# Patient Record
Sex: Female | Born: 1990 | Race: White | Hispanic: No | Marital: Single | Smoking: Current every day smoker
Health system: Southern US, Community
[De-identification: ages and names within clinical notes are randomized; demographics above are authoritative.]

## PROBLEM LIST (undated history)

## (undated) DIAGNOSIS — F32A Depression, unspecified: Secondary | ICD-10-CM

## (undated) DIAGNOSIS — G43909 Migraine, unspecified, not intractable, without status migrainosus: Secondary | ICD-10-CM

## (undated) DIAGNOSIS — F329 Major depressive disorder, single episode, unspecified: Secondary | ICD-10-CM

## (undated) HISTORY — PX: APPENDECTOMY: SHX54

---

## 2016-07-26 ENCOUNTER — Encounter (HOSPITAL_BASED_OUTPATIENT_CLINIC_OR_DEPARTMENT_OTHER): Payer: Self-pay | Admitting: Emergency Medicine

## 2016-07-26 ENCOUNTER — Emergency Department (HOSPITAL_BASED_OUTPATIENT_CLINIC_OR_DEPARTMENT_OTHER)
Admission: EM | Admit: 2016-07-26 | Discharge: 2016-07-26 | Disposition: A | Discharge status: Home / Self Care | Payer: 59 | Attending: Emergency Medicine | Admitting: Emergency Medicine

## 2016-07-26 ENCOUNTER — Emergency Department (HOSPITAL_BASED_OUTPATIENT_CLINIC_OR_DEPARTMENT_OTHER): Payer: 59

## 2016-07-26 DIAGNOSIS — Z79899 Other long term (current) drug therapy: Secondary | ICD-10-CM | POA: Diagnosis not present

## 2016-07-26 DIAGNOSIS — K529 Noninfective gastroenteritis and colitis, unspecified: Secondary | ICD-10-CM

## 2016-07-26 DIAGNOSIS — N342 Other urethritis: Secondary | ICD-10-CM

## 2016-07-26 DIAGNOSIS — R1013 Epigastric pain: Secondary | ICD-10-CM | POA: Diagnosis present

## 2016-07-26 DIAGNOSIS — F17220 Nicotine dependence, chewing tobacco, uncomplicated: Secondary | ICD-10-CM | POA: Diagnosis not present

## 2016-07-26 HISTORY — DX: Major depressive disorder, single episode, unspecified: F32.9

## 2016-07-26 HISTORY — DX: Migraine, unspecified, not intractable, without status migrainosus: G43.909

## 2016-07-26 HISTORY — DX: Depression, unspecified: F32.A

## 2016-07-26 LAB — COMPREHENSIVE METABOLIC PANEL
ALBUMIN: 3.8 g/dL (ref 3.5–5.0)
ALK PHOS: 82 U/L (ref 38–126)
ALT: 27 U/L (ref 14–54)
AST: 27 U/L (ref 15–41)
Anion gap: 8 (ref 5–15)
BILIRUBIN TOTAL: 0.4 mg/dL (ref 0.3–1.2)
BUN: 7 mg/dL (ref 6–20)
CALCIUM: 8.9 mg/dL (ref 8.9–10.3)
CO2: 23 mmol/L (ref 22–32)
CREATININE: 0.7 mg/dL (ref 0.44–1.00)
Chloride: 105 mmol/L (ref 101–111)
GFR calc Af Amer: >60 mL/min (ref >60)
GFR calc non Af Amer: >60 mL/min (ref >60)
GLUCOSE: 123 mg/dL — AB (ref 65–99)
Potassium: 3.6 mmol/L (ref 3.5–5.1)
SODIUM: 136 mmol/L (ref 135–145)
TOTAL PROTEIN: 6.8 g/dL (ref 6.5–8.1)

## 2016-07-26 LAB — URINALYSIS, MICROSCOPIC (REFLEX)

## 2016-07-26 LAB — CBC WITH DIFFERENTIAL/PLATELET
BASOS ABS: 0 10*3/uL (ref 0.0–0.1)
Basophils Relative: 0 %
Eosinophils Absolute: 0.1 10*3/uL (ref 0.0–0.7)
Eosinophils Relative: 1 %
HEMATOCRIT: 41.9 % (ref 36.0–46.0)
HEMOGLOBIN: 14.1 g/dL (ref 12.0–15.0)
Lymphocytes Relative: 10 %
Lymphs Abs: 1.2 10*3/uL (ref 0.7–4.0)
MCH: 28.9 pg (ref 26.0–34.0)
MCHC: 33.7 g/dL (ref 30.0–36.0)
MCV: 85.9 fL (ref 78.0–100.0)
MONOS PCT: 5 %
Monocytes Absolute: 0.6 10*3/uL (ref 0.1–1.0)
NEUTROS ABS: 10 10*3/uL — AB (ref 1.7–7.7)
NEUTROS PCT: 84 %
Platelets: 287 10*3/uL (ref 150–400)
RBC: 4.88 MIL/uL (ref 3.87–5.11)
RDW: 13.8 % (ref 11.5–15.5)
WBC: 11.9 10*3/uL — AB (ref 4.0–10.5)

## 2016-07-26 LAB — URINALYSIS, ROUTINE W REFLEX MICROSCOPIC
Glucose, UA: NEGATIVE mg/dL
HGB URINE DIPSTICK: NEGATIVE
Ketones, ur: 15 mg/dL — AB
NITRITE: POSITIVE — AB
PH: 5 (ref 5.0–8.0)
Protein, ur: NEGATIVE mg/dL
SPECIFIC GRAVITY, URINE: 1.037 — AB (ref 1.005–1.030)

## 2016-07-26 LAB — LIPASE, BLOOD: Lipase: 14 U/L (ref 11–51)

## 2016-07-26 LAB — PREGNANCY, URINE: PREG TEST UR: NEGATIVE

## 2016-07-26 MED ORDER — DICYCLOMINE HCL 10 MG PO CAPS
10.0000 mg | ORAL_CAPSULE | Freq: Once | ORAL | Status: AC
Start: 2016-07-26 — End: 2016-07-26
  Administered 2016-07-26: 10 mg via ORAL
  Filled 2016-07-26: qty 1

## 2016-07-26 MED ORDER — DICYCLOMINE HCL 20 MG PO TABS: 20.0000 mg | ORAL_TABLET | Freq: Two times a day (BID) | ORAL | 0 refills | Status: AC | PRN

## 2016-07-26 MED ORDER — METOCLOPRAMIDE HCL 10 MG PO TABS: 10.0000 mg | ORAL_TABLET | Freq: Four times a day (QID) | ORAL | 0 refills | Status: AC | PRN

## 2016-07-26 MED ORDER — HYDROCODONE-ACETAMINOPHEN 5-325 MG PO TABS: 1.0000 | ORAL_TABLET | Freq: Four times a day (QID) | ORAL | 0 refills | Status: DC | PRN

## 2016-07-26 MED ORDER — SODIUM CHLORIDE 0.9 % IV BOLUS (SEPSIS)
1000.0000 mL | Freq: Once | INTRAVENOUS | Status: AC
  Administered 2016-07-26: 1000 mL via INTRAVENOUS

## 2016-07-26 MED ORDER — DEXTROSE 5 % IV SOLN
1.0000 g | Freq: Once | INTRAVENOUS | Status: AC
  Administered 2016-07-26: 1 g via INTRAVENOUS
  Filled 2016-07-26: qty 10

## 2016-07-26 MED ORDER — MORPHINE SULFATE (PF) 4 MG/ML IV SOLN
4.0000 mg | Freq: Once | INTRAVENOUS | Status: AC
  Administered 2016-07-26: 4 mg via INTRAVENOUS
  Filled 2016-07-26: qty 1

## 2016-07-26 MED ORDER — IOPAMIDOL (ISOVUE-300) INJECTION 61%
100.0000 mL | Freq: Once | INTRAVENOUS | Status: AC | PRN
  Administered 2016-07-26: 100 mL via INTRAVENOUS

## 2016-07-26 MED ORDER — CIPROFLOXACIN HCL 500 MG PO TABS: 500.0000 mg | ORAL_TABLET | Freq: Two times a day (BID) | ORAL | 0 refills | Status: DC

## 2016-07-26 MED ORDER — FAMOTIDINE IN NACL 20-0.9 MG/50ML-% IV SOLN
20.0000 mg | Freq: Once | INTRAVENOUS | Status: AC
Start: 2016-07-26 — End: 2016-07-26
  Administered 2016-07-26: 20 mg via INTRAVENOUS
  Filled 2016-07-26: qty 50

## 2016-07-26 MED ORDER — ONDANSETRON HCL 4 MG/2ML IJ SOLN
4.0000 mg | Freq: Once | INTRAMUSCULAR | Status: AC
Start: 2016-07-26 — End: 2016-07-26
  Administered 2016-07-26: 4 mg via INTRAVENOUS
  Filled 2016-07-26: qty 2

## 2016-07-26 MED FILL — CIPROFLOXACIN HCL 500 MG TA: 500 | 7 days supply | Qty: 14 | Fill #0

## 2016-07-26 MED FILL — HYDROCODON-APAP 5-325: 5-325 | 2 days supply | Qty: 6 | Fill #0

## 2016-07-26 MED FILL — DICYCLOMINE 20 MG TABLET: 20 | 7 days supply | Qty: 15 | Fill #0

## 2016-07-26 MED FILL — METOCLOPRAMIDE 10 MG TABLET: 10 | 2 days supply | Qty: 10 | Fill #0

## 2016-07-26 NOTE — ED Notes (Signed)
ED Provider at bedside. 

## 2016-07-26 NOTE — ED Notes (Signed)
Pt ambulated to restroom with 2 staff assist.

## 2016-07-26 NOTE — ED Triage Notes (Signed)
Pt reports having pain in her side and her back. Pt also reports nausea and vomiting. Pt reports this started about 0400 this am. Pt denies hx of kidney stones.

## 2016-07-26 NOTE — ED Notes (Signed)
Pt returned from CT at this time.  

## 2016-07-26 NOTE — Discharge Instructions (Signed)
Take cipro twice daily for a week for urinary tract infection and colitis.   You have an inflamed colon and likely will have pain for several days.   Take tylenol, motrin for pain.   Take reglan as needed for nausea or vomiting. Stay hydrated.   Take bentyl for abdominal cramps.   Take vicodin for severe abdominal pain. Do NOT drive with it   See your doctor  Return to ER if you have severe abdominal pain, vomiting, fevers, dehydration.

## 2016-07-26 NOTE — ED Provider Notes (Signed)
[facility]   CSN: [id number] Arrival date & time: 07/26/16  0631     History   Chief Complaint Chief Complaint  Patient presents with  . Abdominal Pain    Possible kidney stone    HPI Barbara Wright is a 26 y.o. female hx of depression, migraines, With acute onset of abdominal pain, vomiting. Patient states that she did eat some [place] food last night but it was all cooked. This morning she woke up around 3 AM and had sudden onset of severe bilateral flank pain and epigastric pain that is constant and sharp.. Patient states that she was unable to get back to sleep due to the severe pain. Patient had several episodes of nonbilious nonbloody vomiting. Denies any diarrhea. She has an IUD placed recently but denies any vaginal bleeding and denies any dysuria but she wasn't sure if there is any blood in her exam. Has no history of kidney stones. Had a previous appendectomy. Patient denies any drug or alcohol use.    The history is provided by the patient.    Past Medical History:  Diagnosis Date  . Depression   . Migraines     There are no active problems to display for this patient.   Past Surgical History:  Procedure Laterality Date  . APPENDECTOMY      OB History    No data available       Home Medications    Prior to Admission medications   Medication Sig Start Date End Date Taking? Authorizing Provider  buPROPion (WELLBUTRIN XL) 300 MG 24 hr tablet Take 300 mg by mouth daily.   Yes Historical Provider, MD    Family History No family history on file.  Social History Social History  Substance Use Topics  . Smoking status: Never Smoker  . Smokeless tobacco: Current User    Types: Chew  . Alcohol use No     Allergies   Patient has no known allergies.   Review of Systems Review of Systems  Gastrointestinal: Positive for abdominal pain and vomiting.  All other systems reviewed and are negative.    Physical Exam Updated  Vital Signs BP 105/68 (BP Location: Left Arm)   Pulse 84   Temp 97.8 F (36.6 C) (Oral)   Resp 18   Ht  (1.702 m)   Wt 180 lb (81.6 kg)   SpO2 96%   BMI 28.19 kg/m   Physical Exam  Constitutional: She is oriented to person, place, and time.  Uncomfortable, holding her upper abdomen   HENT:  Head: Normocephalic.  MM dry   Eyes: EOM are normal. Pupils are equal, round, and reactive to light.  Neck: Normal range of motion. Neck supple.  Cardiovascular: Normal rate, regular rhythm and normal heart sounds.   Pulmonary/Chest: Effort normal and breath sounds normal. No respiratory distress. She has no wheezes. She has no rales.  Abdominal: Soft. Bowel sounds are normal.  + diffuse epigastric, RUQ, and LUQ and bilateral CVAT. No lower abdominal tenderness   Musculoskeletal: Normal range of motion.  Neurological: She is alert and oriented to person, place, and time. No cranial nerve deficit. Coordination normal.  Skin: Skin is warm.  Psychiatric: She has a normal mood and affect.  Nursing note and vitals reviewed.    ED Treatments / Results  Labs (all labs ordered are listed, but only abnormal results are displayed) Labs Reviewed  URINALYSIS, ROUTINE W REFLEX MICROSCOPIC - Abnormal; Notable for the following:  Result Value   Color, Urine ORANGE (*)    APPearance CLOUDY (*)    Specific Gravity, Urine 1.037 (*)    Bilirubin Urine MODERATE (*)    Ketones, ur 15 (*)    Nitrite POSITIVE (*)    Leukocytes, UA SMALL (*)    All other components within normal limits  CBC WITH DIFFERENTIAL/PLATELET - Abnormal; Notable for the following:    WBC 11.9 (*)    Neutro Abs 10.0 (*)    All other components within normal limits  COMPREHENSIVE METABOLIC PANEL - Abnormal; Notable for the following:    Glucose, Bld 123 (*)    All other components within normal limits  URINALYSIS, MICROSCOPIC (REFLEX) - Abnormal; Notable for the following:    Bacteria, UA MANY (*)    Squamous  Epithelial / LPF 6-30 (*)    All other components within normal limits  URINE CULTURE  PREGNANCY, URINE  LIPASE, BLOOD    EKG  EKG Interpretation None       Radiology Ct Abdomen Pelvis W Contrast  Result Date: 07/26/2016 CLINICAL DATA:  Unable to drink oral contrast fusion nausea and vomiting. Left-sided flank pain. EXAM: CT ABDOMEN AND PELVIS WITH CONTRAST TECHNIQUE: Multidetector CT imaging of the abdomen and pelvis was performed using the standard protocol following bolus administration of intravenous contrast. CONTRAST:  ISOVUE-300 IOPAMIDOL (ISOVUE-300) INJECTION 61% COMPARISON:  None. FINDINGS: Lower chest: No acute abnormality. Hepatobiliary: No focal liver abnormality is seen. No gallstones, gallbladder wall thickening, or biliary dilatation. Pancreas: Unremarkable. No pancreatic ductal dilatation or surrounding inflammatory changes. Spleen: Normal in size without focal abnormality. Adrenals/Urinary Tract: Adrenal glands are unremarkable. Kidneys are normal, without renal calculi, focal lesion, or hydronephrosis. Bladder is unremarkable. Stomach/Bowel: Stomach is within normal limits. Prior appendectomy. No bowel dilatation to suggest obstruction. Large amount of stool in the ascending and transverse colon. Moderate amount of stool in the descending colon. Mild bowel wall thickening involving the hepatic flexure which may reflect mild colitis. No pneumatosis, pneumoperitoneum or portal venous gas. Vascular/Lymphatic: Normal caliber abdominal aorta. No lymphadenopathy. Reproductive: Uterus and bilateral adnexa are unremarkable. Intrauterine device in the uterus. Other: No fluid collection or hematoma. Musculoskeletal: No acute osseous abnormality. No lytic or sclerotic osseous lesion. IMPRESSION: 1. Moderate amount of stool in the descending colon. Mild bowel wall thickening involving the hepatic flexure which may reflect mild colitis secondary to an infectious or inflammatory etiology.  Electronically Signed   By: Elige Ko   On: 07/26/2016 08:48    Procedures Procedures (including critical care time)  Medications Ordered in ED Medications  ondansetron [facility]) injection 4 mg (4 mg Intravenous Given 07/26/16 0702)  sodium chloride 0.9 % bolus 1,000 mL (1,000 mLs Intravenous New Bag/Given 07/26/16 0728)  morphine 4 MG/ML injection 4 mg (4 mg Intravenous Given 07/26/16 0728)  famotidine (PEPCID) IVPB 20 mg premix (0 mg Intravenous Stopped 07/26/16 0752)  cefTRIAXone (ROCEPHIN) 1 g in dextrose 5 % 50 mL IVPB (0 g Intravenous Stopped 07/26/16 0816)  iopamidol (ISOVUE-300) 61 % injection 100 mL (100 mLs Intravenous Contrast Given 07/26/16 0830)  dicyclomine (BENTYL) capsule 10 mg (10 mg Oral Given 07/26/16 0935)  morphine 4 MG/ML injection 4 mg (4 mg Intravenous Given 07/26/16 0935)     Initial Impression / Assessment and Plan / ED Course  I have reviewed the triage vital signs and the nursing notes.  Pertinent labs & imaging results that were available during my care of the patient were reviewed by me and considered  in my medical decision making (see chart for details).     Barbara Wright is a 26 y.o. female here with acute onset of upper abdominal pain. Consider gastroenteritis vs pancreatitis vs perforated ulcer vs cholecystitis vs renal colic vs pyelo. Will get labs, LFTs, lipase, UA, CT ab/pel.    9:47 AM WBC 12. UA + leuk and nitrate and bacteria but only 0-5 WBC. CT showed constipation and colitis. I think the UA likely contamination from colitis. I gave her rocephin IV in the ED, IVF, bentyl, pain meds and zofran. She tolerated PO in the ED and pain controlled. Will try cipro as it will cover most bacteria in the colon and UTI. I will hold off on flagyl for now as she has been vomiting and I doubt C diff. Urine culture sent.   Final Clinical Impressions(s) / ED Diagnoses   Final diagnoses:  None    New Prescriptions New Prescriptions   No medications on file       Charlynne Pander, MD 07/26/16 [phone]

## 2016-07-26 NOTE — ED Notes (Signed)
Pt provided with PO ginger ale at this time

## 2016-07-26 NOTE — ED Notes (Signed)
Pt transported to CT via stretcher at this time.  

## 2016-07-26 NOTE — ED Notes (Signed)
MD at bedside discussing results with patient at this time. 

## 2016-07-27 LAB — URINE CULTURE: CULTURE: NO GROWTH

## 2016-08-27 ENCOUNTER — Emergency Department (HOSPITAL_BASED_OUTPATIENT_CLINIC_OR_DEPARTMENT_OTHER)
Admission: EM | Admit: 2016-08-27 | Discharge: 2016-08-28 | Disposition: A | Discharge status: Home / Self Care | Payer: 59 | Attending: Emergency Medicine | Admitting: Emergency Medicine

## 2016-08-27 ENCOUNTER — Encounter (HOSPITAL_BASED_OUTPATIENT_CLINIC_OR_DEPARTMENT_OTHER): Payer: Self-pay | Admitting: Emergency Medicine

## 2016-08-27 DIAGNOSIS — F17220 Nicotine dependence, chewing tobacco, uncomplicated: Secondary | ICD-10-CM | POA: Insufficient documentation

## 2016-08-27 DIAGNOSIS — G43001 Migraine without aura, not intractable, with status migrainosus: Secondary | ICD-10-CM | POA: Diagnosis not present

## 2016-08-27 DIAGNOSIS — Z79899 Other long term (current) drug therapy: Secondary | ICD-10-CM | POA: Diagnosis not present

## 2016-08-27 DIAGNOSIS — R51 Headache: Secondary | ICD-10-CM | POA: Diagnosis present

## 2016-08-27 NOTE — ED Provider Notes (Signed)
[facility]   CSN: [id number] Arrival date & time: 08/27/16  2157  By signing my name below, I, Rosario Adie, attest that this documentation has been prepared under the direction and in the presence of Emillio Ngo, Mayer Masker, MD. Electronically Signed: Rosario Adie, ED Scribe. 08/28/16. 12:09 AM.  History   Chief Complaint Chief Complaint  Patient presents with  . Migraine   The history is provided by the patient. No language interpreter was used.    HPI Comments: Barbara Wright is a 26 y.o. female with a PMHx of migraines, who presents to the Emergency Department complaining of persistent generalized headache beginning several hours ago. She rates her current pain as 8/10. She notes associated nausea, vomiting, and photophobia. Pt has a h/o migraines and she states that her pain feels similar to this. Her pain is worse with bright light. She has not taken anything for her pain prior to coming into the ED. Pt typically takes Triptan for her migraines, however, she is currently out of this medication and was unable to fill her prescription tonight. She denies fever, neck stiffness, or any other associated symptoms.   Past Medical History:  Diagnosis Date  . Depression   . Migraines    There are no active problems to display for this patient.  Past Surgical History:  Procedure Laterality Date  . APPENDECTOMY     OB History    No data available     Home Medications    Prior to Admission medications   Medication Sig Start Date End Date Taking? Authorizing Provider  buPROPion (WELLBUTRIN XL) 300 MG 24 hr tablet Take 300 mg by mouth daily.    [provider]  ciprofloxacin (CIPRO) 500 MG tablet Take 1 tablet (500 mg total) by mouth 2 (two) times daily. One po bid x 7 days 07/26/16   Charlynne Pander, MD  dicyclomine (BENTYL) 20 MG tablet Take 1 tablet (20 mg total) by mouth 2 (two) times daily as needed for spasms. 07/26/16   Charlynne Pander, MD  HYDROcodone-acetaminophen (NORCO/VICODIN) 5-325 MG tablet Take 1 tablet by mouth every 6 (six) hours as needed. 07/26/16   Charlynne Pander, MD  metoCLOPramide (REGLAN) 10 MG tablet Take 1 tablet (10 mg total) by mouth every 6 (six) hours as needed for nausea (nausea/headache). 07/26/16   Charlynne Pander, MD   Family History No family history on file.  Social History Social History  Substance Use Topics  . Smoking status: Never Smoker  . Smokeless tobacco: Current User    Types: Chew  . Alcohol use No   Allergies   Patient has no known allergies.  Review of Systems Review of Systems  Constitutional: Negative for fever.  Eyes: Positive for photophobia.  Gastrointestinal: Positive for nausea and vomiting.  Musculoskeletal: Negative for neck stiffness.  Neurological: Positive for headaches.  All other systems reviewed and are negative.  Physical Exam Updated Vital Signs BP 114/78 (BP Location: Right Arm)   Pulse 88   Temp 98.7 F (37.1 C) (Oral)   Resp 18   Ht 5\' 7"  (1.702 m)   Wt 170 lb (77.1 kg)   SpO2 100%   BMI 26.63 kg/m   Physical Exam  Constitutional: She is oriented to person, place, and time. She appears well-developed and well-nourished.  Eyes closed and uncomfortable appearing but in no acute distress  HENT:  Head: Normocephalic and atraumatic.  Eyes: EOM are normal. Pupils are equal, round,  and reactive to light.  Neck: Normal range of motion. Neck supple.  No meningismus  Cardiovascular: Normal rate, regular rhythm and normal heart sounds.   Pulmonary/Chest: Effort normal and breath sounds normal. No respiratory distress. She has no wheezes.  Neurological: She is alert and oriented to person, place, and time.  Cranial nerves II through XII intact, 5 out of 5 strength in all 4 extremities, no dysmetria to finger-nose-finger  Skin: Skin is warm and dry.  Psychiatric: She has a normal mood and affect.  Nursing note and vitals reviewed.  ED  Treatments / Results  DIAGNOSTIC STUDIES: Oxygen Saturation is 100% on RA, normal by my interpretation.   COORDINATION OF CARE: 11:59 PM-Discussed next steps with pt. Pt verbalized understanding and is agreeable with the plan.   Labs (all labs ordered are listed, but only abnormal results are displayed) Labs Reviewed - No data to display  EKG  EKG Interpretation None      Radiology No results found.  Procedures Procedures   Medications Ordered in ED Medications  sodium chloride 0.9 % bolus 1,000 mL (1,000 mLs Intravenous New Bag/Given 08/28/16 0120)  prochlorperazine (COMPAZINE) injection 10 mg (10 mg Intravenous Given 08/28/16 0122)  diphenhydrAMINE (BENADRYL) injection 25 mg (25 mg Intravenous Given 08/28/16 0121)  ketorolac (TORADOL) 30 MG/ML injection 30 mg (30 mg Intravenous Given 08/28/16 0121)  ondansetron (ZOFRAN) injection 4 mg (4 mg Intravenous Given 08/28/16 0121)    Initial Impression / Assessment and Plan / ED Course  I have reviewed the triage vital signs and the nursing notes.  Pertinent labs & imaging results that were available during my care of the patient were reviewed by me and considered in my medical decision making (see chart for details).     Patient presents with headache. Reports headache is consistent with migraine. She is nontoxic and nonfocal. Patient was given migraine cocktail.    2:40 AM On recheck, patient reports improvement of symptoms. She states that she feels ready to go home.  After history, exam, and medical workup I feel the patient has been appropriately medically screened and is safe for discharge home. Pertinent diagnoses were discussed with the patient. Patient was given return precautions.   Final Clinical Impressions(s) / ED Diagnoses   Final diagnoses:  Migraine without aura and with status migrainosus, not intractable   New Prescriptions New Prescriptions   No medications on file   I personally performed the services  described in this documentation, which was scribed in my presence. The recorded information has been reviewed and is accurate.     Shon BatonHorton, Raevin Wierenga F, MD 08/28/16 [phone]

## 2016-08-27 NOTE — ED Triage Notes (Signed)
PT presents to ED with complaints of migraine for afew hours now pt sts she is out of her migraine meds to take at home.

## 2016-08-28 MED ORDER — SODIUM CHLORIDE 0.9 % IV BOLUS (SEPSIS)
1000.0000 mL | Freq: Once | INTRAVENOUS | Status: AC
Start: 2016-08-28 — End: 2016-08-28
  Administered 2016-08-28: 1000 mL via INTRAVENOUS

## 2016-08-28 MED ORDER — ONDANSETRON HCL 4 MG/2ML IJ SOLN
4.0000 mg | Freq: Once | INTRAMUSCULAR | Status: AC
  Administered 2016-08-28: 4 mg via INTRAVENOUS
  Filled 2016-08-28: qty 2

## 2016-08-28 MED ORDER — DIPHENHYDRAMINE HCL 50 MG/ML IJ SOLN
25.0000 mg | Freq: Once | INTRAMUSCULAR | Status: AC
  Administered 2016-08-28: 25 mg via INTRAVENOUS
  Filled 2016-08-28: qty 1

## 2016-08-28 MED ORDER — KETOROLAC TROMETHAMINE 30 MG/ML IJ SOLN
30.0000 mg | Freq: Once | INTRAMUSCULAR | Status: AC
  Administered 2016-08-28: 30 mg via INTRAVENOUS
  Filled 2016-08-28: qty 1

## 2016-08-28 MED ORDER — PROCHLORPERAZINE EDISYLATE 5 MG/ML IJ SOLN
10.0000 mg | Freq: Once | INTRAMUSCULAR | Status: AC
  Administered 2016-08-28: 10 mg via INTRAVENOUS
  Filled 2016-08-28: qty 2

## 2017-02-17 ENCOUNTER — Emergency Department (HOSPITAL_BASED_OUTPATIENT_CLINIC_OR_DEPARTMENT_OTHER): Payer: Self-pay

## 2017-02-17 ENCOUNTER — Emergency Department (HOSPITAL_BASED_OUTPATIENT_CLINIC_OR_DEPARTMENT_OTHER)
Admission: EM | Admit: 2017-02-17 | Discharge: 2017-02-17 | Disposition: A | Discharge status: Home / Self Care | Payer: Self-pay | Attending: Emergency Medicine | Admitting: Emergency Medicine

## 2017-02-17 ENCOUNTER — Encounter (HOSPITAL_BASED_OUTPATIENT_CLINIC_OR_DEPARTMENT_OTHER): Payer: Self-pay | Admitting: Emergency Medicine

## 2017-02-17 DIAGNOSIS — M25462 Effusion, left knee: Secondary | ICD-10-CM | POA: Insufficient documentation

## 2017-02-17 DIAGNOSIS — F17210 Nicotine dependence, cigarettes, uncomplicated: Secondary | ICD-10-CM | POA: Insufficient documentation

## 2017-02-17 DIAGNOSIS — R600 Localized edema: Secondary | ICD-10-CM | POA: Insufficient documentation

## 2017-02-17 DIAGNOSIS — R609 Edema, unspecified: Secondary | ICD-10-CM

## 2017-02-17 DIAGNOSIS — Z79899 Other long term (current) drug therapy: Secondary | ICD-10-CM | POA: Insufficient documentation

## 2017-02-17 LAB — COMPREHENSIVE METABOLIC PANEL
ALK PHOS: 80 U/L (ref 38–126)
ALT: 24 U/L (ref 14–54)
AST: 27 U/L (ref 15–41)
Albumin: 3.4 g/dL — ABNORMAL LOW (ref 3.5–5.0)
Anion gap: 4 — ABNORMAL LOW (ref 5–15)
BILIRUBIN TOTAL: 0.4 mg/dL (ref 0.3–1.2)
BUN: 11 mg/dL (ref 6–20)
CALCIUM: 8.4 mg/dL — AB (ref 8.9–10.3)
CO2: 25 mmol/L (ref 22–32)
CREATININE: 0.65 mg/dL (ref 0.44–1.00)
Chloride: 106 mmol/L (ref 101–111)
GFR calc non Af Amer: >60 mL/min (ref >60)
GLUCOSE: 109 mg/dL — AB (ref 65–99)
Potassium: 3.6 mmol/L (ref 3.5–5.1)
SODIUM: 135 mmol/L (ref 135–145)
TOTAL PROTEIN: 6.4 g/dL — AB (ref 6.5–8.1)

## 2017-02-17 LAB — CBC WITH DIFFERENTIAL/PLATELET
BASOS PCT: 1 %
Basophils Absolute: 0.1 10*3/uL (ref 0.0–0.1)
Eosinophils Absolute: 0.4 10*3/uL (ref 0.0–0.7)
Eosinophils Relative: 7 %
HEMATOCRIT: 37.3 % (ref 36.0–46.0)
HEMOGLOBIN: 12.1 g/dL (ref 12.0–15.0)
LYMPHS ABS: 1.8 10*3/uL (ref 0.7–4.0)
LYMPHS PCT: 28 %
MCH: 27.1 pg (ref 26.0–34.0)
MCHC: 32.4 g/dL (ref 30.0–36.0)
MCV: 83.4 fL (ref 78.0–100.0)
MONO ABS: 0.5 10*3/uL (ref 0.1–1.0)
MONOS PCT: 8 %
NEUTROS ABS: 3.7 10*3/uL (ref 1.7–7.7)
NEUTROS PCT: 56 %
Platelets: 274 10*3/uL (ref 150–400)
RBC: 4.47 MIL/uL (ref 3.87–5.11)
RDW: 14.2 % (ref 11.5–15.5)
WBC: 6.5 10*3/uL (ref 4.0–10.5)

## 2017-02-17 LAB — URINALYSIS, ROUTINE W REFLEX MICROSCOPIC
BILIRUBIN URINE: NEGATIVE
GLUCOSE, UA: NEGATIVE mg/dL
Hgb urine dipstick: NEGATIVE
Ketones, ur: NEGATIVE mg/dL
Leukocytes, UA: NEGATIVE
NITRITE: NEGATIVE
PH: 5.5 (ref 5.0–8.0)
Protein, ur: NEGATIVE mg/dL
SPECIFIC GRAVITY, URINE: 1.025 (ref 1.005–1.030)

## 2017-02-17 LAB — TSH: TSH: 0.688 u[IU]/mL (ref 0.350–4.500)

## 2017-02-17 LAB — D-DIMER, QUANTITATIVE: D-Dimer, Quant: 1.94 ug/mL-FEU — ABNORMAL HIGH (ref 0.00–0.50)

## 2017-02-17 LAB — PREGNANCY, URINE: Preg Test, Ur: NEGATIVE

## 2017-02-17 MED ORDER — HYDROCHLOROTHIAZIDE 25 MG PO TABS
25.0000 mg | ORAL_TABLET | Freq: Once | ORAL | Status: AC
  Administered 2017-02-17: 25 mg via ORAL
  Filled 2017-02-17: qty 1

## 2017-02-17 MED ORDER — HYDROCHLOROTHIAZIDE 25 MG PO TABS: 25.0000 mg | ORAL_TABLET | Freq: Every day | ORAL | 0 refills | Status: DC

## 2017-02-17 MED ORDER — KETOROLAC TROMETHAMINE 30 MG/ML IJ SOLN
30.0000 mg | Freq: Once | INTRAMUSCULAR | Status: AC
  Administered 2017-02-17: 30 mg via INTRAVENOUS

## 2017-02-17 MED ORDER — KETOROLAC TROMETHAMINE 10 MG PO TABS: 10.0000 mg | ORAL_TABLET | Freq: Four times a day (QID) | ORAL | 0 refills | Status: AC | PRN

## 2017-02-17 MED ORDER — KETOROLAC TROMETHAMINE 30 MG/ML IJ SOLN
30.0000 mg | Freq: Once | INTRAMUSCULAR | Status: DC
  Filled 2017-02-17: qty 1

## 2017-02-17 NOTE — Discharge Instructions (Signed)
Compression hose while at work

## 2017-02-17 NOTE — ED Provider Notes (Signed)
[facility] EMERGENCY DEPARTMENT Provider Note   CSN: [id number] Arrival date & time: 02/17/17  [id number]     History   Chief Complaint Chief Complaint  Patient presents with  . Knee Pain    swelling to legs    HPI Barbara Wright is a 26 y.o. female.  Pt presents to the ED today with bilateral leg swelling and left knee pain.  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  She said she wears tennis shoes at work.  She has not tried compression hose.  The pt noticed that her legs have been swollen for about 2 weeks.  Her knee started hurting as well and it hurts to bend it.      Past Medical History:  Diagnosis Date  . Depression   . Migraines     There are no active problems to display for this patient.   Past Surgical History:  Procedure Laterality Date  . APPENDECTOMY      OB History    No data available       Home Medications    Prior to Admission medications   Medication Sig Start Date End Date Taking? Authorizing Provider  promethazine (PHENERGAN) 25 MG tablet Take 25 mg by mouth every 6 (six) hours as needed for nausea or vomiting.   Yes [provider]  SUMAtriptan (IMITREX) 100 MG tablet Take 100 mg by mouth every 2 (two) hours as needed for migraine. May repeat in 2 hours if headache persists or recurs.   Yes [provider]  buPROPion (WELLBUTRIN XL) 300 MG 24 hr tablet Take 300 mg by mouth daily.    [provider]  ciprofloxacin (CIPRO) 500 MG tablet Take 1 tablet (500 mg total) by mouth 2 (two) times daily. One po bid x 7 days 07/26/16   Charlynne PanderYao, David Hsienta, MD  dicyclomine (BENTYL) 20 MG tablet Take 1 tablet (20 mg total) by mouth 2 (two) times daily as needed for spasms. 07/26/16   Charlynne PanderYao, David Hsienta, MD  hydrochlorothiazide (HYDRODIURIL) 25 MG tablet Take 1 tablet (25 mg total) by mouth daily. 02/17/17   Jacalyn LefevreHaviland, Lasheena Frieze, MD  HYDROcodone-acetaminophen (NORCO/VICODIN)  5-325 MG tablet Take 1 tablet by mouth every 6 (six) hours as needed. 07/26/16   Charlynne PanderYao, David Hsienta, MD  ketorolac (TORADOL) 10 MG tablet Take 1 tablet (10 mg total) by mouth every 6 (six) hours as needed. 02/17/17   Jacalyn LefevreHaviland, Nakoa Ganus, MD  metoCLOPramide (REGLAN) 10 MG tablet Take 1 tablet (10 mg total) by mouth every 6 (six) hours as needed for nausea (nausea/headache). 07/26/16   Charlynne PanderYao, David Hsienta, MD    Family History No family history on file.  Social History Social History  Substance Use Topics  . Smoking status: Current Every Day Smoker    Packs/day: 0.50    Types: Cigarettes  . Smokeless tobacco: Former NeurosurgeonUser    Types: Chew  . Alcohol use No     Allergies   Patient has no known allergies.   Review of Systems Review of Systems  Cardiovascular: Positive for leg swelling.  Musculoskeletal:       Left knee pain  All other systems reviewed and are negative.    Physical Exam Updated Vital Signs BP 103/65 (BP Location: Left Arm)   Pulse 79   Temp 98.6 F (37 C) (Oral)   Resp 16   Ht 5\' 7"  (1.702 m)   Wt  81.6 kg (180 lb)   SpO2 99%   BMI 28.19 kg/m   Physical Exam  Constitutional: She is oriented to person, place, and time. She appears well-developed and well-nourished.  HENT:  Head: Normocephalic and atraumatic.  Right Ear: External ear normal.  Left Ear: External ear normal.  Nose: Nose normal.  Mouth/Throat: Oropharynx is clear and moist.  Eyes: Pupils are equal, round, and reactive to light. Conjunctivae and EOM are normal.  Neck: Normal range of motion. Neck supple.  Cardiovascular: Normal rate, regular rhythm, normal heart sounds and intact distal pulses.   Pulmonary/Chest: Effort normal and breath sounds normal.  Abdominal: Soft. Bowel sounds are normal.  Musculoskeletal: She exhibits edema.  Neurological: She is alert and oriented to person, place, and time.  Skin: Skin is warm and dry.  Psychiatric: She has a normal mood and affect. Her behavior is  normal. Judgment and thought content normal.  Nursing note and vitals reviewed.    ED Treatments / Results  Labs (all labs ordered are listed, but only abnormal results are displayed) Labs Reviewed  COMPREHENSIVE METABOLIC PANEL - Abnormal; Notable for the following:       Result Value   Glucose, Bld 109 (*)    Calcium 8.4 (*)    Total Protein 6.4 (*)    Albumin 3.4 (*)    Anion gap 4 (*)    All other components within normal limits  D-DIMER, QUANTITATIVE (NOT AT [facility]) - Abnormal; Notable for the following:    D-Dimer, Quant 1.94 (*)    All other components within normal limits  CBC WITH DIFFERENTIAL/PLATELET  URINALYSIS, ROUTINE W REFLEX MICROSCOPIC  PREGNANCY, URINE  TSH    EKG  EKG Interpretation None       Radiology US Venous Img Lower Bilateral  Result Date: 02/17/2017 CLINICAL DATA:  Bilateral leg and foot swelling for 3 weeks. EXAM: BILATERAL LOWER EXTREMITY VENOUS DOPPLER ULTRASOUND TECHNIQUE: Gray-scale sonography with graded compression, as well as color Doppler and duplex ultrasound were performed to evaluate the lower extremity deep venous systems from the level of the common femoral vein and including the common femoral, femoral, profunda femoral, popliteal and calf veins including the posterior tibial, peroneal and gastrocnemius veins when visible. The superficial great saphenous vein was also interrogated. Spectral Doppler was utilized to evaluate flow at rest and with distal augmentation maneuvers in the common femoral, femoral and popliteal veins. COMPARISON:  None. FINDINGS: RIGHT LOWER EXTREMITY Common Femoral Vein: No evidence of thrombus. Normal compressibility, respiratory phasicity and response to augmentation. Saphenofemoral Junction: No evidence of thrombus. Normal compressibility and flow on color Doppler imaging. Profunda Femoral Vein: No evidence of thrombus. Normal compressibility and flow on color Doppler imaging. Femoral Vein: No evidence of  thrombus. Normal compressibility, respiratory phasicity and response to augmentation. Popliteal Vein: No evidence of thrombus. Normal compressibility, respiratory phasicity and response to augmentation. Calf Veins: No evidence of thrombus. Normal compressibility and flow on color Doppler imaging. Superficial Great Saphenous Vein: No evidence of thrombus. Normal compressibility. LEFT LOWER EXTREMITY Common Femoral Vein: No evidence of thrombus. Normal compressibility, respiratory phasicity and response to augmentation. Saphenofemoral Junction: No evidence of thrombus. Normal compressibility and flow on color Doppler imaging. Profunda Femoral Vein: No evidence of thrombus. Normal compressibility and flow on color Doppler imaging. Femoral Vein: No evidence of thrombus. Normal compressibility, respiratory phasicity and response to augmentation. Popliteal Vein: No evidence of thrombus. Normal compressibility, respiratory phasicity and response to augmentation. Calf Veins: No evidence of thrombus. Normal compressibility  and flow on color Doppler imaging. Superficial Great Saphenous Vein: No evidence of thrombus. Normal compressibility. IMPRESSION: No evidence of deep venous thrombosis. Electronically Signed   By: Richarda Overlie M.D.   On: 02/17/2017 11:15   Dg Knee Complete 4 Views Left  Result Date: 02/17/2017 CLINICAL DATA:  Left knee pain and swelling without injury. EXAM: LEFT KNEE - COMPLETE 4+ VIEW COMPARISON:  None. FINDINGS: No evidence of fracture, dislocation, or joint effusion. No evidence of arthropathy or other focal bone abnormality. Soft tissues are unremarkable. IMPRESSION: Negative. Electronically Signed   By: Richarda Overlie M.D.   On: 02/17/2017 08:55    Procedures Procedures (including critical care time)  Medications Ordered in ED Medications  hydrochlorothiazide (HYDRODIURIL) tablet 25 mg (not administered)  ketorolac (TORADOL) 30 MG/ML injection 30 mg (30 mg Intravenous Given 02/17/17 1059)      Initial Impression / Assessment and Plan / ED Course  I have reviewed the triage vital signs and the nursing notes.  Pertinent labs & imaging results that were available during my care of the patient were reviewed by me and considered in my medical decision making (see chart for details).    Pt encouraged to wear compression hose at work and to wear supportive shoes.  She knows to return if worse.  Final Clinical Impressions(s) / ED Diagnoses   Final diagnoses:  Peripheral edema  Effusion of left knee    New Prescriptions New Prescriptions   HYDROCHLOROTHIAZIDE (HYDRODIURIL) 25 MG TABLET    Take 1 tablet (25 mg total) by mouth daily.   KETOROLAC (TORADOL) 10 MG TABLET    Take 1 tablet (10 mg total) by mouth every 6 (six) hours as needed.     Jacalyn Lefevre, MD 02/17/17 1126

## 2017-02-17 NOTE — ED Notes (Signed)
Remains in US.

## 2017-02-17 NOTE — ED Triage Notes (Signed)
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 Denies recent injury

## 2017-02-17 NOTE — ED Notes (Signed)
Patient transported to X-ray 

## 2017-02-17 NOTE — ED Notes (Signed)
Given ginger ale 

## 2017-02-17 NOTE — ED Notes (Signed)
Patient transported to Ultrasound 

## 2017-04-22 ENCOUNTER — Encounter (HOSPITAL_BASED_OUTPATIENT_CLINIC_OR_DEPARTMENT_OTHER): Payer: Self-pay | Admitting: *Deleted

## 2017-04-22 ENCOUNTER — Emergency Department (HOSPITAL_BASED_OUTPATIENT_CLINIC_OR_DEPARTMENT_OTHER)
Admission: EM | Admit: 2017-04-22 | Discharge: 2017-04-22 | Disposition: A | Discharge status: Home / Self Care | Payer: No Typology Code available for payment source | Attending: Emergency Medicine | Admitting: Emergency Medicine

## 2017-04-22 ENCOUNTER — Other Ambulatory Visit: Payer: Self-pay

## 2017-04-22 DIAGNOSIS — R600 Localized edema: Secondary | ICD-10-CM | POA: Insufficient documentation

## 2017-04-22 DIAGNOSIS — Z79899 Other long term (current) drug therapy: Secondary | ICD-10-CM | POA: Insufficient documentation

## 2017-04-22 DIAGNOSIS — M62838 Other muscle spasm: Secondary | ICD-10-CM | POA: Insufficient documentation

## 2017-04-22 DIAGNOSIS — R350 Frequency of micturition: Secondary | ICD-10-CM | POA: Diagnosis present

## 2017-04-22 DIAGNOSIS — F17210 Nicotine dependence, cigarettes, uncomplicated: Secondary | ICD-10-CM | POA: Diagnosis not present

## 2017-04-22 DIAGNOSIS — M7918 Myalgia, other site: Secondary | ICD-10-CM | POA: Insufficient documentation

## 2017-04-22 LAB — URINALYSIS, ROUTINE W REFLEX MICROSCOPIC
Bilirubin Urine: NEGATIVE
GLUCOSE, UA: NEGATIVE mg/dL
Hgb urine dipstick: NEGATIVE
KETONES UR: NEGATIVE mg/dL
LEUKOCYTES UA: NEGATIVE
Nitrite: NEGATIVE
PH: 6.5 (ref 5.0–8.0)
Protein, ur: NEGATIVE mg/dL
SPECIFIC GRAVITY, URINE: 1.025 (ref 1.005–1.030)

## 2017-04-22 LAB — CBC
HCT: 37.2 % (ref 36.0–46.0)
HEMOGLOBIN: 12 g/dL (ref 12.0–15.0)
MCH: 26.7 pg (ref 26.0–34.0)
MCHC: 32.3 g/dL (ref 30.0–36.0)
MCV: 82.7 fL (ref 78.0–100.0)
Platelets: 253 10*3/uL (ref 150–400)
RBC: 4.5 MIL/uL (ref 3.87–5.11)
RDW: 14.3 % (ref 11.5–15.5)
WBC: 5 10*3/uL (ref 4.0–10.5)

## 2017-04-22 LAB — BASIC METABOLIC PANEL
Anion gap: 6 (ref 5–15)
BUN: 12 mg/dL (ref 6–20)
CHLORIDE: 104 mmol/L (ref 101–111)
CO2: 28 mmol/L (ref 22–32)
CREATININE: 0.7 mg/dL (ref 0.44–1.00)
Calcium: 8.6 mg/dL — ABNORMAL LOW (ref 8.9–10.3)
GFR calc Af Amer: >60 mL/min (ref >60)
GFR calc non Af Amer: >60 mL/min (ref >60)
GLUCOSE: 99 mg/dL (ref 65–99)
POTASSIUM: 4 mmol/L (ref 3.5–5.1)
Sodium: 138 mmol/L (ref 135–145)

## 2017-04-22 LAB — PREGNANCY, URINE: Preg Test, Ur: NEGATIVE

## 2017-04-22 MED ORDER — METHOCARBAMOL 500 MG PO TABS: 500.0000 mg | ORAL_TABLET | Freq: Every evening | ORAL | 0 refills | Status: AC | PRN

## 2017-04-22 MED ORDER — NAPROXEN 500 MG PO TABS: 500.0000 mg | ORAL_TABLET | Freq: Two times a day (BID) | ORAL | 0 refills | Status: AC

## 2017-04-22 NOTE — ED Notes (Signed)
ED Provider at bedside. 
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
## 2017-05-29 ENCOUNTER — Emergency Department (HOSPITAL_COMMUNITY)
Admission: EM | Admit: 2017-05-29 | Discharge: 2017-05-29 | Disposition: A | Discharge status: Home / Self Care | Payer: Self-pay | Attending: Emergency Medicine | Admitting: Emergency Medicine

## 2017-05-29 ENCOUNTER — Encounter (HOSPITAL_BASED_OUTPATIENT_CLINIC_OR_DEPARTMENT_OTHER): Payer: Self-pay | Admitting: *Deleted

## 2017-05-29 ENCOUNTER — Emergency Department (HOSPITAL_BASED_OUTPATIENT_CLINIC_OR_DEPARTMENT_OTHER)
Admission: EM | Admit: 2017-05-29 | Discharge: 2017-05-29 | Disposition: A | Discharge status: Home / Self Care | Payer: Self-pay | Attending: Emergency Medicine | Admitting: Emergency Medicine

## 2017-05-29 ENCOUNTER — Encounter (HOSPITAL_COMMUNITY): Payer: Self-pay

## 2017-05-29 ENCOUNTER — Other Ambulatory Visit: Payer: Self-pay

## 2017-05-29 DIAGNOSIS — G43909 Migraine, unspecified, not intractable, without status migrainosus: Secondary | ICD-10-CM | POA: Insufficient documentation

## 2017-05-29 DIAGNOSIS — Z5321 Procedure and treatment not carried out due to patient leaving prior to being seen by health care provider: Secondary | ICD-10-CM | POA: Insufficient documentation

## 2017-05-29 NOTE — ED Notes (Signed)
Pt reports that she is leaving due to wait times  

## 2017-05-29 NOTE — ED Triage Notes (Signed)
Pt has had a migraine for two day with nausea and photosensitivity, ran out of Imitrex yesterday

## 2017-05-29 NOTE — ED Notes (Signed)
PT PAGED IN LOBBY. NO ANSWER 

## 2017-05-29 NOTE — ED Triage Notes (Signed)
Pt c/o migraine x 2 days.  ?

## 2017-05-29 NOTE — ED Notes (Signed)
Pt called to treatment room with no answer from lobby.  

## 2018-01-22 ENCOUNTER — Emergency Department (HOSPITAL_COMMUNITY): Admission: EM | Admit: 2018-01-22 | Payer: Self-pay | Source: Home / Self Care

## 2018-01-22 NOTE — ED Notes (Signed)
Pt called to be triaged x3 with no response, RN notified.

## 2018-11-12 IMAGING — CT CT ABD-PELV W/ CM
2 of 4 series · 16 of 46 positions shown, 18 images · IV contrast (APPLIED)
Comparison: None.

CLINICAL DATA: Unable to drink oral contrast fusion nausea and
vomiting. Left-sided flank pain.

EXAM:
CT ABDOMEN AND PELVIS WITH CONTRAST
TECHNIQUE: Multidetector CT imaging of the abdomen and pelvis was performed
using the standard protocol following bolus administration of
intravenous contrast.
CONTRAST:  100mL [id number] IOPAMIDOL ([id number]) INJECTION 61%

[Series 2: axial st · axial · 0.97mm/px · z∈[-522,-52]mm · 13 of 102 slices shown, 15 images]
[im 4/102  soft-tissue]
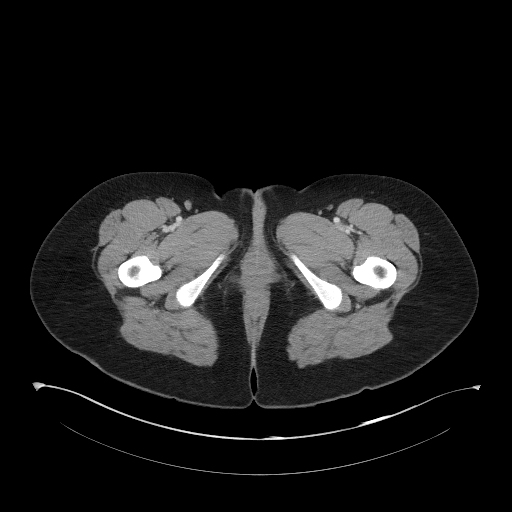
[im 4/102  bone]
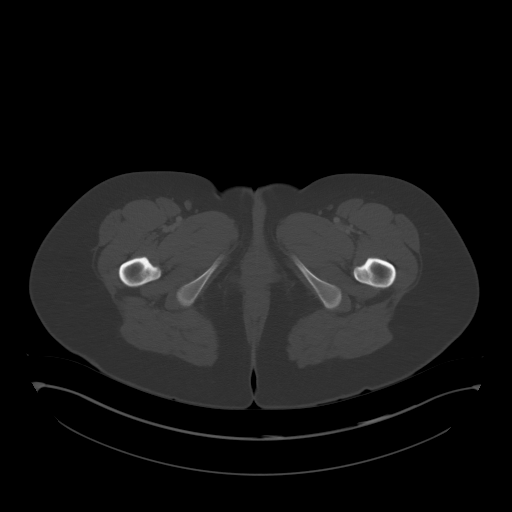
[im 12/102  soft-tissue]
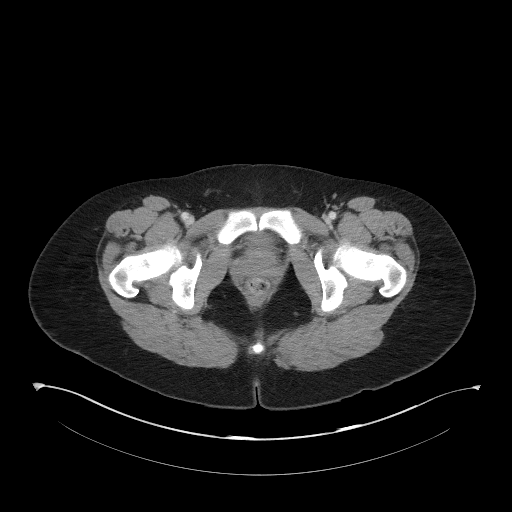
[im 20/102  soft-tissue]
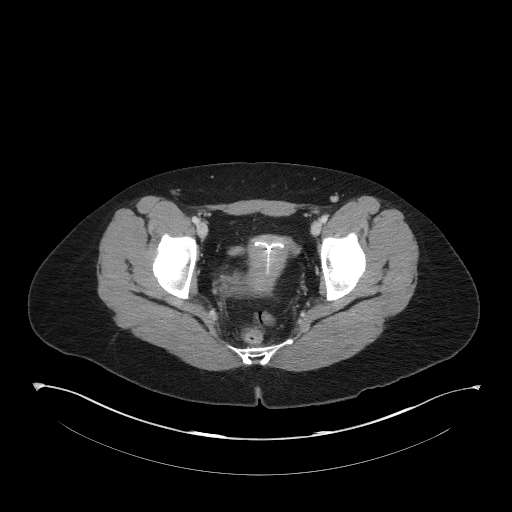
[im 28/102  soft-tissue]
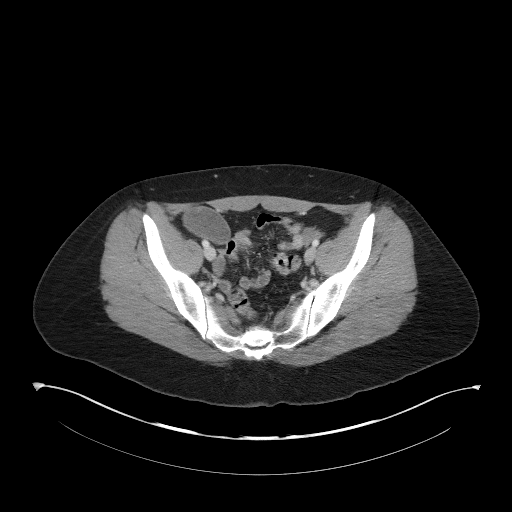
[im 35/102  soft-tissue]
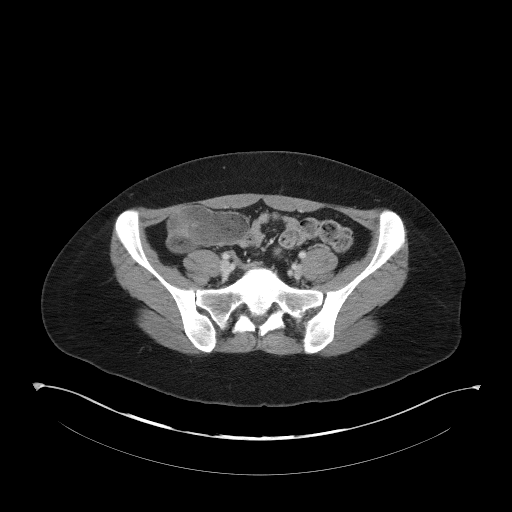
[im 43/102  soft-tissue]
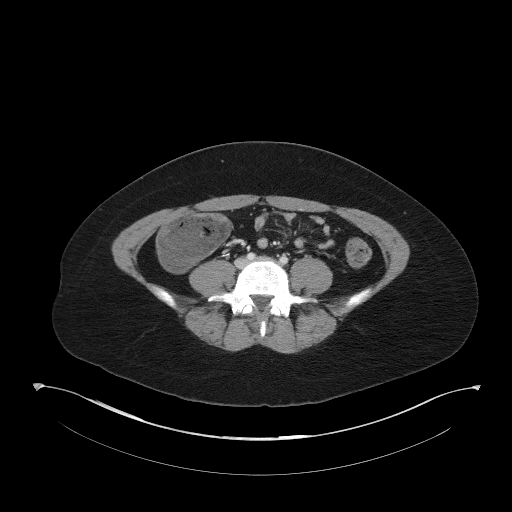
[im 51/102  soft-tissue]
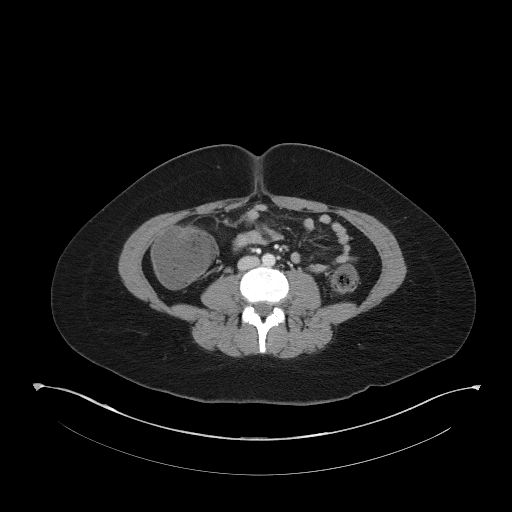
[im 59/102  soft-tissue]
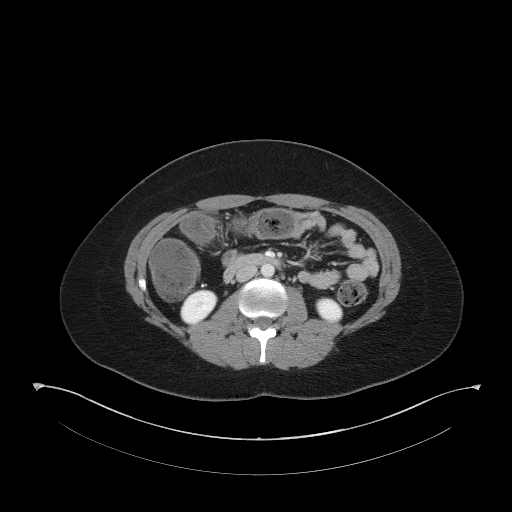
[im 67/102  soft-tissue]
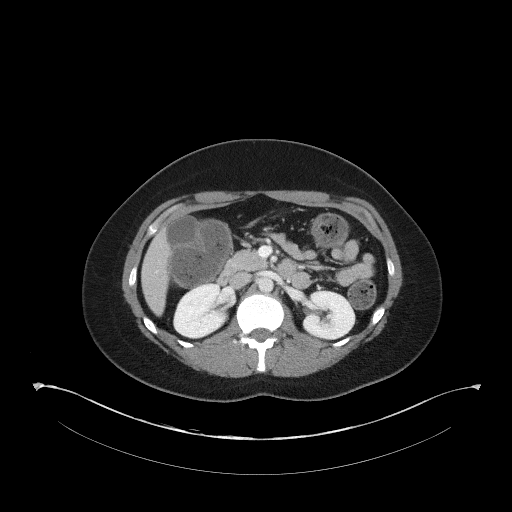
[im 67/102  bone]
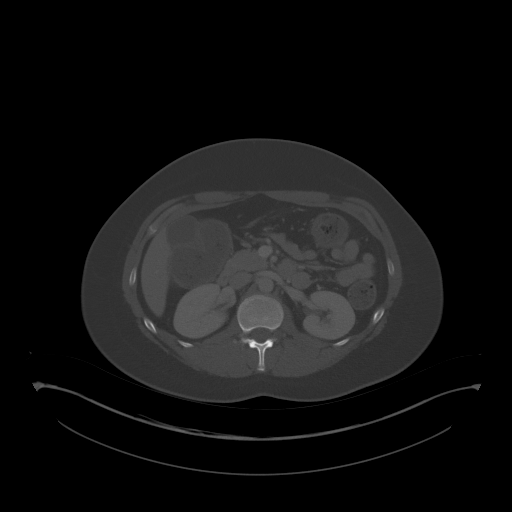
[im 74/102  soft-tissue]
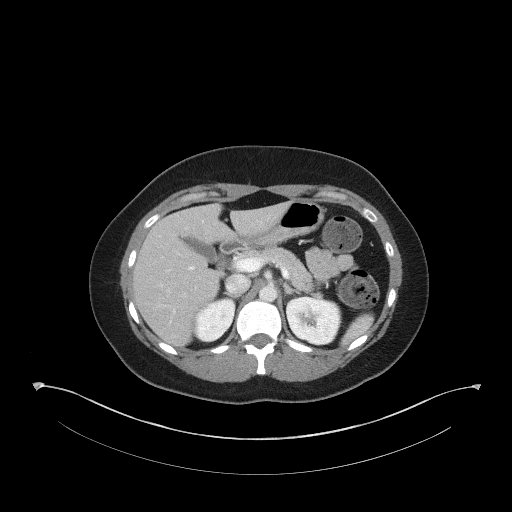
[im 82/102  soft-tissue]
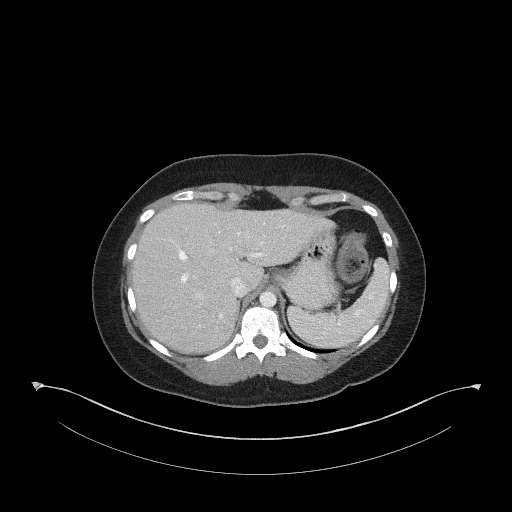
[im 90/102  soft-tissue]
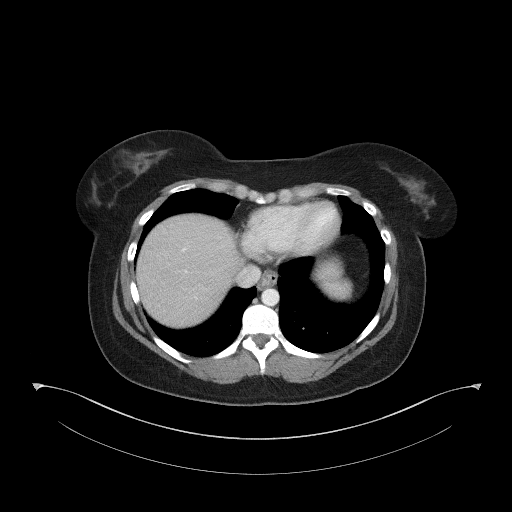
[im 98/102  soft-tissue]
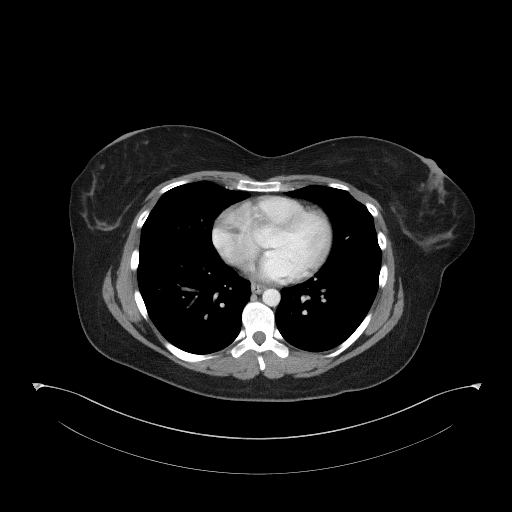

[Series 5: coronal st · coronal · 0.87mm/px · 3 of 89 slices shown]
[im 30/89  soft-tissue]
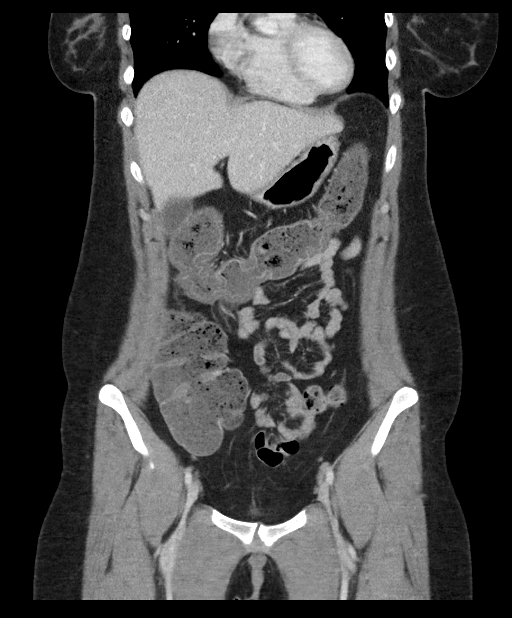
[im 40/89  soft-tissue]
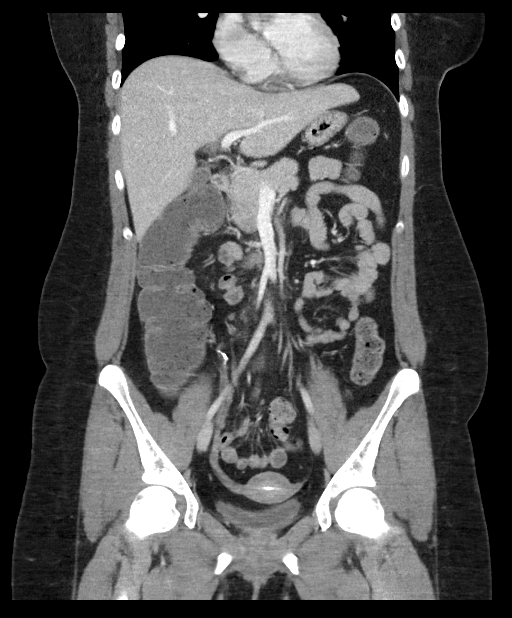
[im 49/89  soft-tissue]
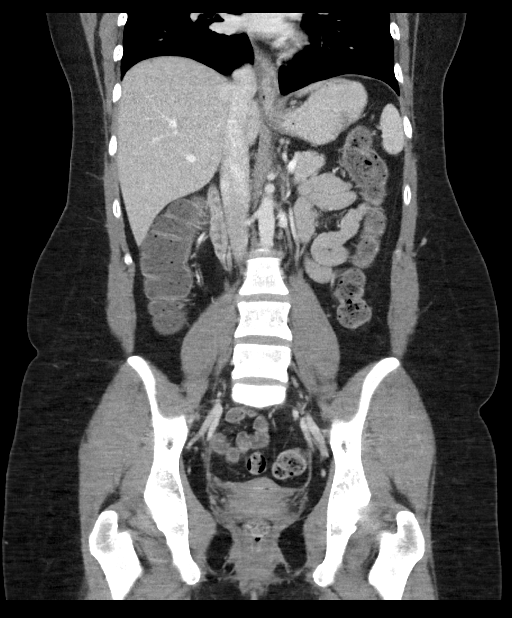

[16 of 46 positions shown; findings below may reference images not displayed]

FINDINGS: Lower chest: No acute abnormality.

Hepatobiliary: No focal liver abnormality is seen. No gallstones,
gallbladder wall thickening, or biliary dilatation.

Pancreas: Unremarkable. No pancreatic ductal dilatation or
surrounding inflammatory changes.

Spleen: Normal in size without focal abnormality.

Adrenals/Urinary Tract: Adrenal glands are unremarkable. Kidneys are
normal, without renal calculi, focal lesion, or hydronephrosis.
Bladder is unremarkable.

Stomach/Bowel: Stomach is within normal limits. Prior appendectomy.
No bowel dilatation to suggest obstruction. Large amount of stool in
the ascending and transverse colon. Moderate amount of stool in the
descending colon. Mild bowel wall thickening involving the hepatic
flexure which may reflect mild colitis. No pneumatosis,
pneumoperitoneum or portal venous gas.

Vascular/Lymphatic: Normal caliber abdominal aorta. No
lymphadenopathy.

Reproductive: Uterus and bilateral adnexa are unremarkable.
Intrauterine device in the uterus.

Other: No fluid collection or hematoma.

Musculoskeletal: No acute osseous abnormality. No lytic or sclerotic
osseous lesion.
IMPRESSION: 1. Moderate amount of stool in the descending colon. Mild bowel wall
thickening involving the hepatic flexure which may reflect mild
colitis secondary to an infectious or inflammatory etiology.
